# Patient Record
Sex: Male | Born: 1985 | Race: Black or African American | Hispanic: No | Marital: Married | Smoking: Current every day smoker
Health system: Southern US, Community
[De-identification: ages and names within clinical notes are randomized; demographics above are authoritative.]

---
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
## 2005-07-27 ENCOUNTER — Emergency Department (HOSPITAL_COMMUNITY): Admission: EM | Admit: 2005-07-27 | Discharge: 2005-07-28 | Payer: Self-pay | Admitting: Emergency Medicine

## 2005-10-25 ENCOUNTER — Emergency Department (HOSPITAL_COMMUNITY): Admission: EM | Admit: 2005-10-25 | Discharge: 2005-10-25 | Payer: Self-pay | Admitting: Emergency Medicine

## 2006-01-12 ENCOUNTER — Emergency Department (HOSPITAL_COMMUNITY): Admission: EM | Admit: 2006-01-12 | Discharge: 2006-01-12 | Payer: Self-pay | Admitting: Family Medicine

## 2007-01-07 IMAGING — CR DG CHEST 2V
2 series · 2 of 2 positions shown · non-contrast
Comparison: None.

CLINICAL DATA: Chest pain. 
 CHEST ? 2 VIEW ? 07/28/05 AT 4144 HOURS:

[w chest pa]
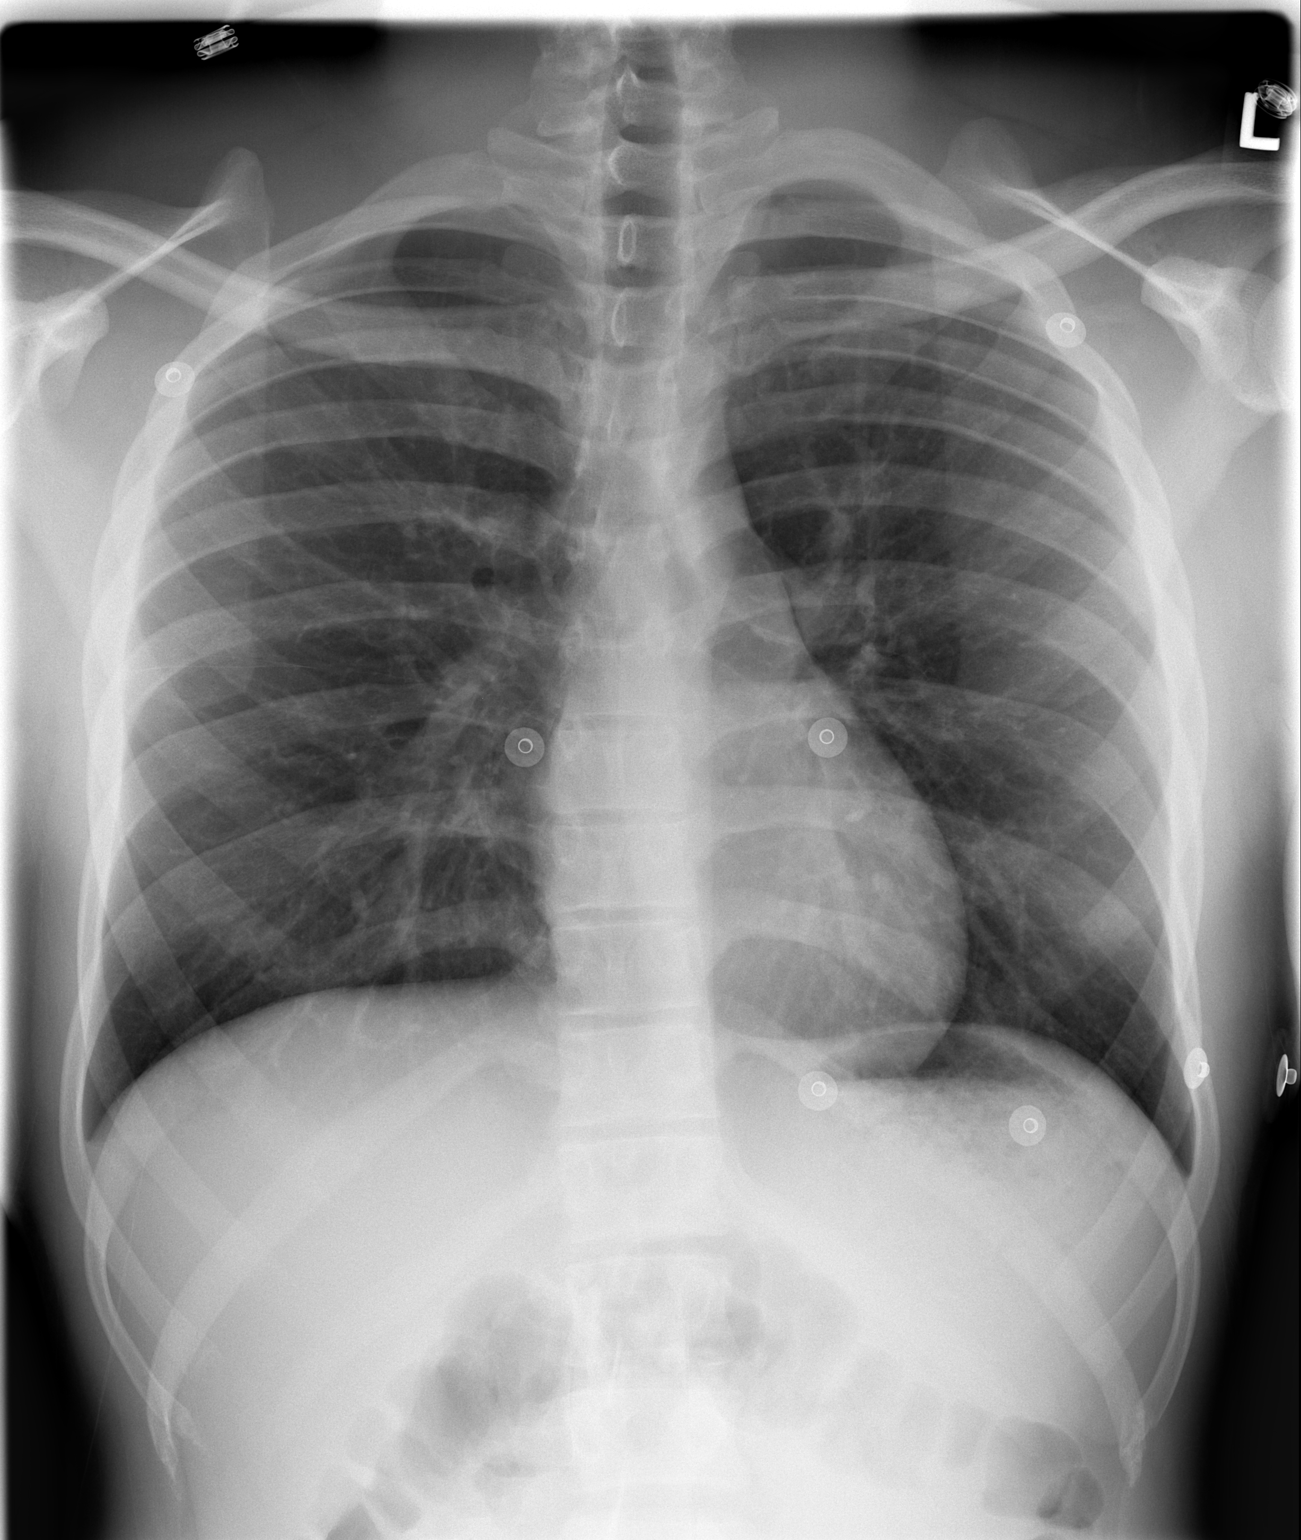

[w chest lat]
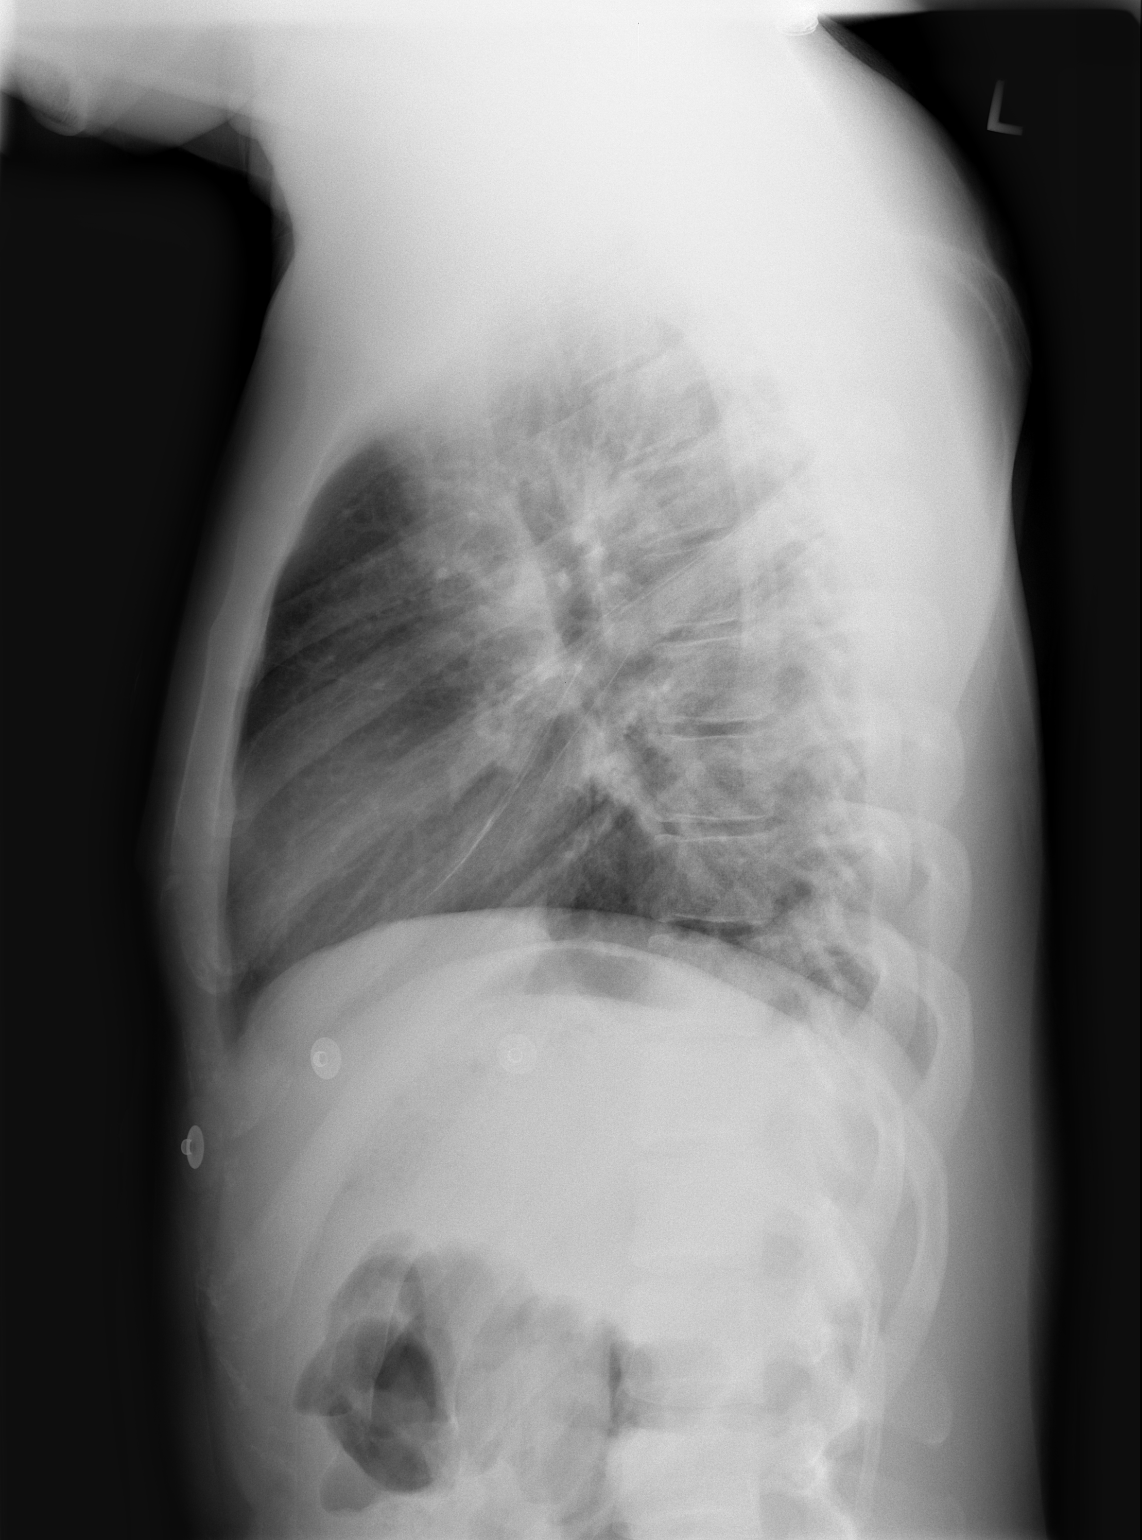

[2 of 2 positions shown; findings below may reference images not displayed]

FINDINGS: The heart size and mediastinal contours are within normal limits.  Both lungs are clear.  The visualized skeletal structures are unremarkable.
IMPRESSION: No active cardiopulmonary disease.

## 2010-01-17 ENCOUNTER — Emergency Department (HOSPITAL_COMMUNITY): Admission: EM | Admit: 2010-01-17 | Discharge: 2010-01-17 | Payer: Self-pay | Admitting: Emergency Medicine

## 2018-04-15 ENCOUNTER — Other Ambulatory Visit: Payer: Self-pay

## 2018-04-15 ENCOUNTER — Encounter (HOSPITAL_COMMUNITY): Payer: Self-pay | Admitting: Emergency Medicine

## 2018-04-15 ENCOUNTER — Ambulatory Visit (HOSPITAL_COMMUNITY)
Admission: EM | Admit: 2018-04-15 | Discharge: 2018-04-15 | Disposition: A | Discharge status: Home / Self Care | Payer: Self-pay | Attending: Internal Medicine | Admitting: Internal Medicine

## 2018-04-15 DIAGNOSIS — Z202 Contact with and (suspected) exposure to infections with a predominantly sexual mode of transmission: Secondary | ICD-10-CM | POA: Insufficient documentation

## 2018-04-15 DIAGNOSIS — Z833 Family history of diabetes mellitus: Secondary | ICD-10-CM | POA: Insufficient documentation

## 2018-04-15 DIAGNOSIS — F17200 Nicotine dependence, unspecified, uncomplicated: Secondary | ICD-10-CM | POA: Insufficient documentation

## 2018-04-15 MED ORDER — METRONIDAZOLE 500 MG PO TABS
ORAL_TABLET | ORAL | Status: AC
  Filled 2018-04-15: qty 4

## 2018-04-15 MED ORDER — METRONIDAZOLE 500 MG PO TABS
2000.0000 mg | ORAL_TABLET | Freq: Once | ORAL | Status: AC
  Administered 2018-04-15: 2000 mg via ORAL

## 2018-04-15 NOTE — ED Provider Notes (Signed)
[facility]    CSN: [id number] Arrival date & time: 04/15/18  1430     History   Chief Complaint Chief Complaint  Patient presents with  . SEXUALLY TRANSMITTED DISEASE    HPI Johnny Stewart is a 32 y.o. male.   Pal presents with concerns about exposure to trichomonas. States his partner was diagnosed two days ago, treated. They do not use condoms. Denies any current complaints or symptoms, denies  Discharge, dysuria, hematuria, pelvic or back pain. No sores or lesions. No fevers. Has had two partners in the past ~6 months. Denies any previous std. Without contributing medical history.     ROS per HPI.      History reviewed. No pertinent past medical history.  There are no active problems to display for this patient.   History reviewed. No pertinent surgical history.     Home Medications    Prior to Admission medications   Not on File    Family History Family History  Problem Relation Age of Onset  . Diabetes Mother     Social History Social History   Tobacco Use  . Smoking status: Current Every Day Smoker  Substance Use Topics  . Alcohol use: Yes  . Drug use: Never     Allergies   Patient has no known allergies.   Review of Systems Review of Systems   Physical Exam Triage Vital Signs ED Triage Vitals  Enc Vitals Group     BP 04/15/18 1550 137/80     Pulse Rate 04/15/18 1550 72     Resp 04/15/18 1550 20     Temp 04/15/18 1550 98.4 F (36.9 C)     Temp Source 04/15/18 1550 Oral     SpO2 04/15/18 1550 100 %     Weight --      Height --      Head Circumference --      Peak Flow --      Pain Score 04/15/18 1547 0     Pain Loc --      Pain Edu? --      Excl. in GC? --    No data found.  Updated Vital Signs BP 137/80 (BP Location: Right Arm) Comment (BP Location): large cuff  Pulse 72   Temp 98.4 F (36.9 C) (Oral)   Resp 20   SpO2 100%    Physical Exam  Constitutional: He is oriented to person, place, and  time. He appears well-developed and well-nourished.  Cardiovascular: Normal rate and regular rhythm.  Pulmonary/Chest: Effort normal and breath sounds normal.  Abdominal: Soft. There is no tenderness. There is no rigidity, no rebound, no guarding, no CVA tenderness, no tenderness at McBurney's point and negative Murphy's sign.  Denies scrotal redness, swelling, pain; denies sores or lesions; gu exam deferred   Neurological: He is alert and oriented to person, place, and time.  Skin: Skin is warm and dry.     UC Treatments / Results  Labs (all labs ordered are listed, but only abnormal results are displayed) Labs Reviewed  URINE CYTOLOGY ANCILLARY ONLY    EKG None  Radiology No results found.  Procedures Procedures (including critical care time)  Medications Ordered in UC Medications  metroNIDAZOLE (FLAGYL) tablet 2,000 mg (has no administration in time range)    Initial Impression / Assessment and Plan / UC Course  I have reviewed the triage vital signs and the nursing notes.  Pertinent labs & imaging results that were available during  my care of the patient were reviewed by me and considered in my medical decision making (see chart for details).     Empiric treatment with 2g flagyl provided today. Urine cytology pending. Will notify of any positive findings and if any changes to treatment are needed.   Encouraged safe sex practices. Patient verbalized understanding and agreeable to plan.   Final Clinical Impressions(s) / UC Diagnoses   Final diagnoses:  Exposure to trichomonas     Discharge Instructions     We have treated you today for trichomonas.  We have tested you for this as well as gonorrhea and chlamydia.  Will notify you of any positive findings and if any changes to treatment are needed.  You may monitor your results on your MyChart online as well.   Please withhold from intercourse for the next week. Please use condoms to prevent STD's.     ED  Prescriptions    None     Controlled Substance Prescriptions [place] Controlled Substance Registry consulted? Not Applicable   Georgetta Haber, NP 04/15/18 1624

## 2018-04-15 NOTE — Discharge Instructions (Signed)
We have treated you today for trichomonas.  We have tested you for this as well as gonorrhea and chlamydia.  Will notify you of any positive findings and if any changes to treatment are needed.  You may monitor your results on your MyChart online as well.   Please withhold from intercourse for the next week. Please use condoms to prevent STD's.

## 2018-04-15 NOTE — ED Triage Notes (Signed)
Reports partner has trich.  Patient denies symptoms

## 2018-04-16 LAB — URINE CYTOLOGY ANCILLARY ONLY
Chlamydia: NEGATIVE
Neisseria Gonorrhea: NEGATIVE
Trichomonas: NEGATIVE
